# Patient Record
Sex: Female | Born: 2000 | Race: White | Hispanic: No | Marital: Single
Health system: Southern US, Community
[De-identification: ages and names within clinical notes are randomized; demographics above are authoritative.]

## PROBLEM LIST (undated history)

## (undated) DIAGNOSIS — Z87448 Personal history of other diseases of urinary system: Secondary | ICD-10-CM

## (undated) DIAGNOSIS — Q969 Turner's syndrome, unspecified: Secondary | ICD-10-CM

## (undated) HISTORY — DX: Personal history of other diseases of urinary system: Z87.448

## (undated) HISTORY — PX: WISDOM TOOTH EXTRACTION: SHX21

## (undated) HISTORY — PX: ADENOIDECTOMY: SUR15

## (undated) HISTORY — DX: Turner's syndrome, unspecified: Q96.9

## (undated) HISTORY — PX: TYMPANOSTOMY TUBE PLACEMENT: SHX32

## (undated) HISTORY — PX: KIDNEY SURGERY: SHX687

## (undated) HISTORY — PX: TONSILLECTOMY: SUR1361

## (undated) HISTORY — PX: DENTAL SURGERY: SHX609

---
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
## 2020-08-15 IMAGING — CR RIGHT KNEE - COMPLETE 4+ VIEW
4 series · 4 of 4 positions shown · non-contrast
Comparison: None.

CLINICAL DATA: Recent fall with knee pain, initial encounter

EXAM:
RIGHT KNEE - COMPLETE 4+ VIEW

[knee ap]
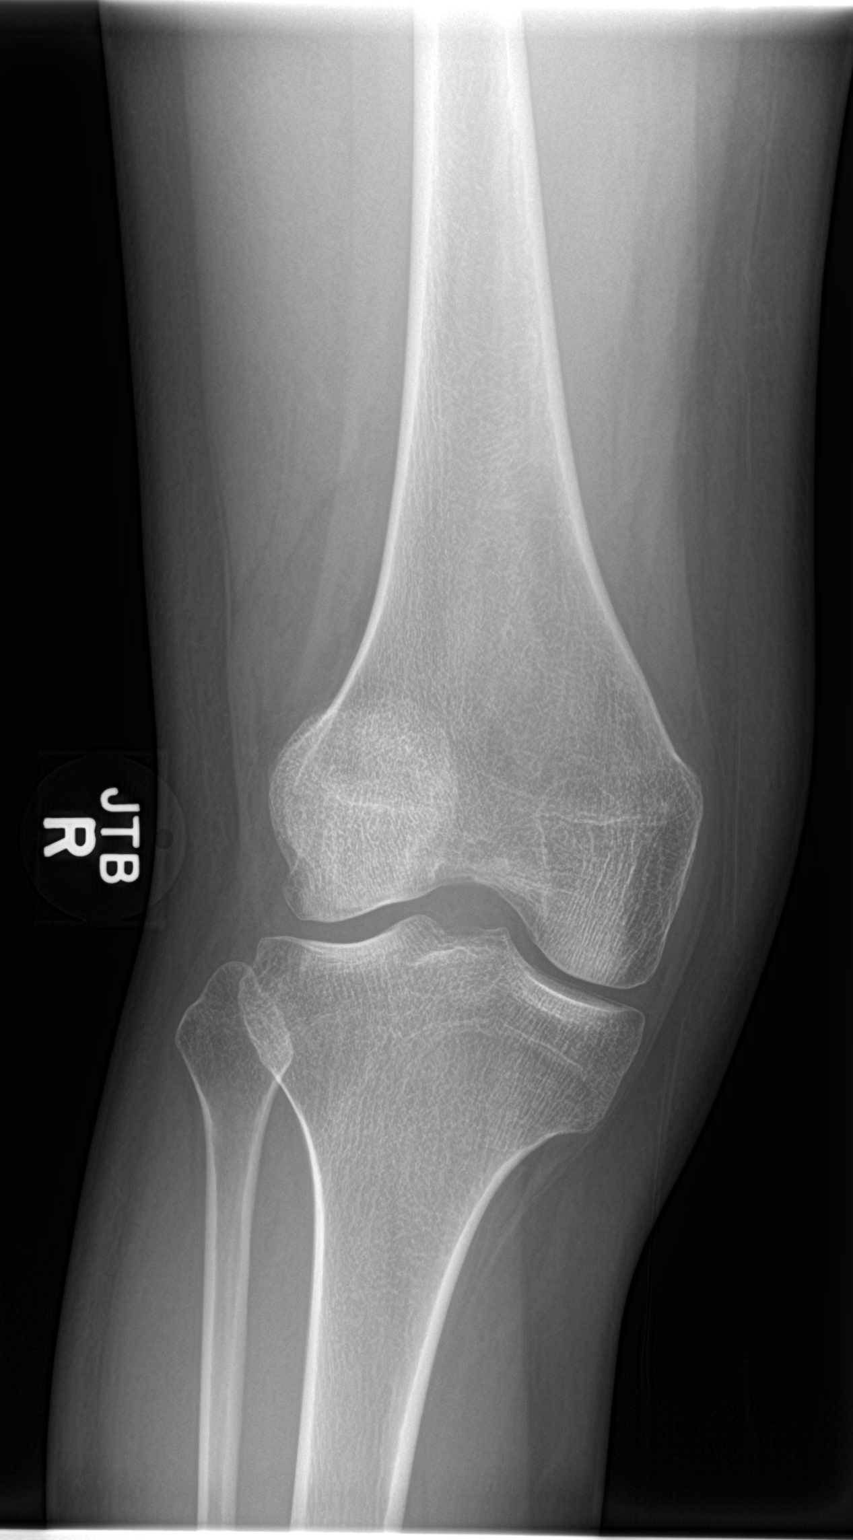

[knee obl (1 of 2)]
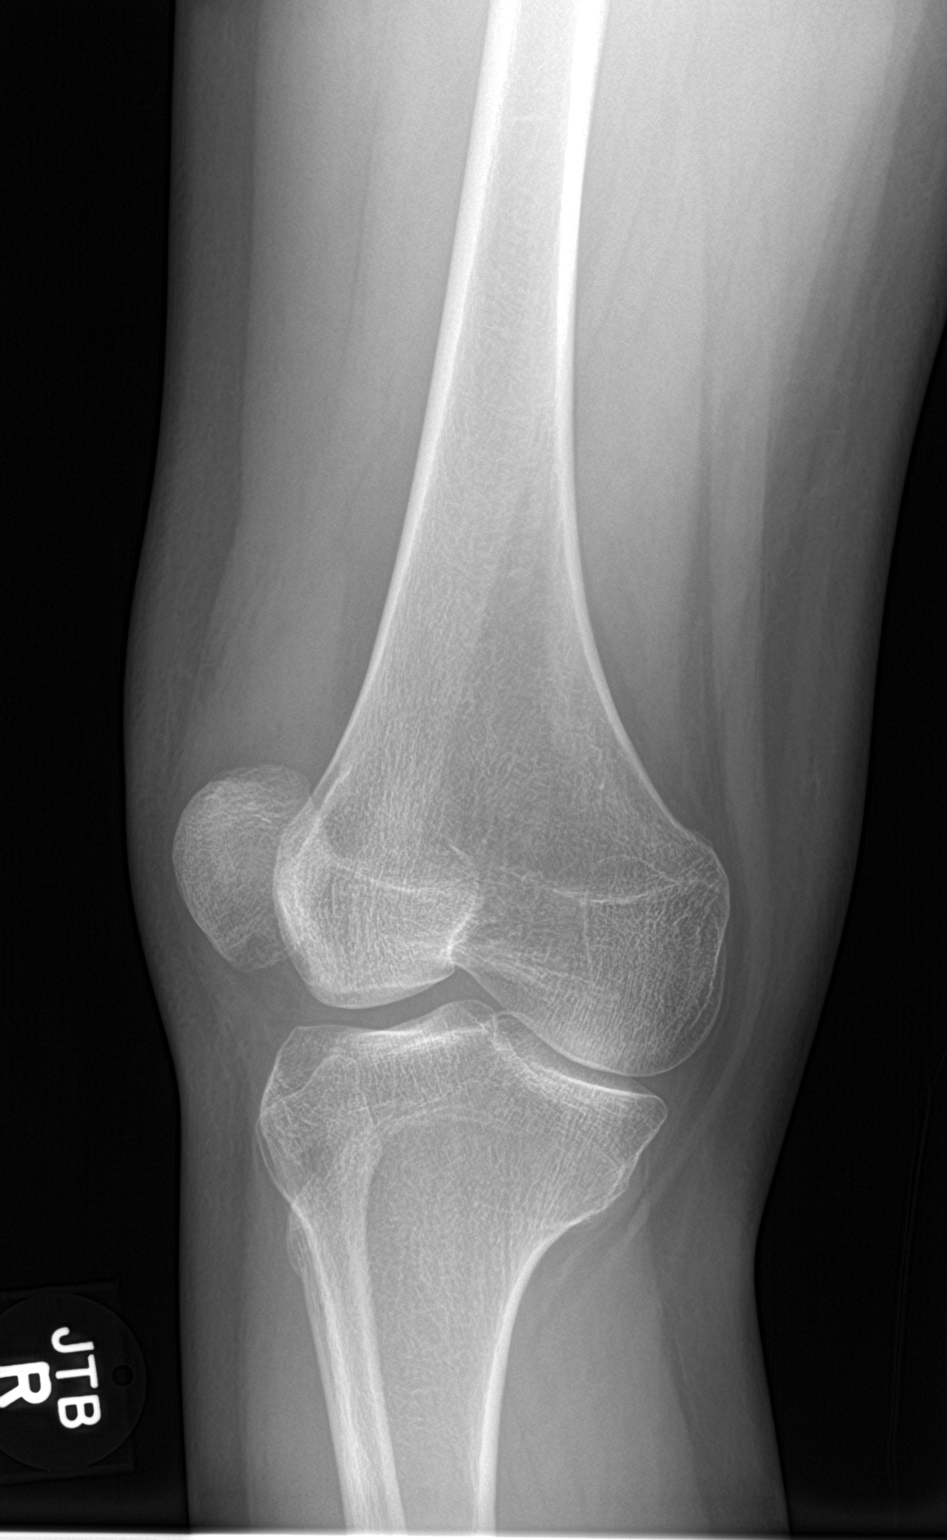

[knee obl (2 of 2)]
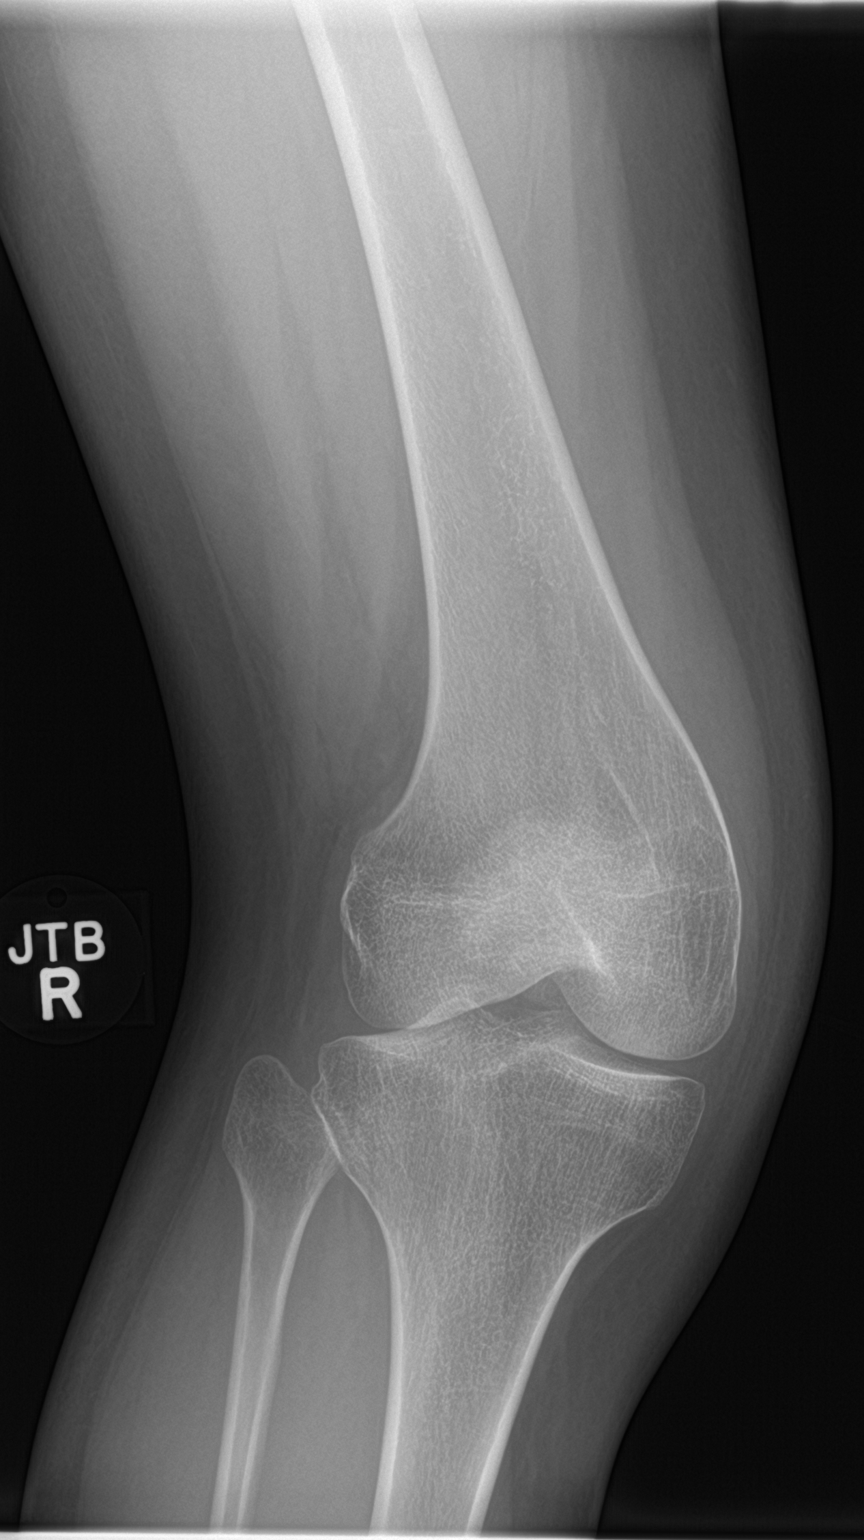

[knee lat]
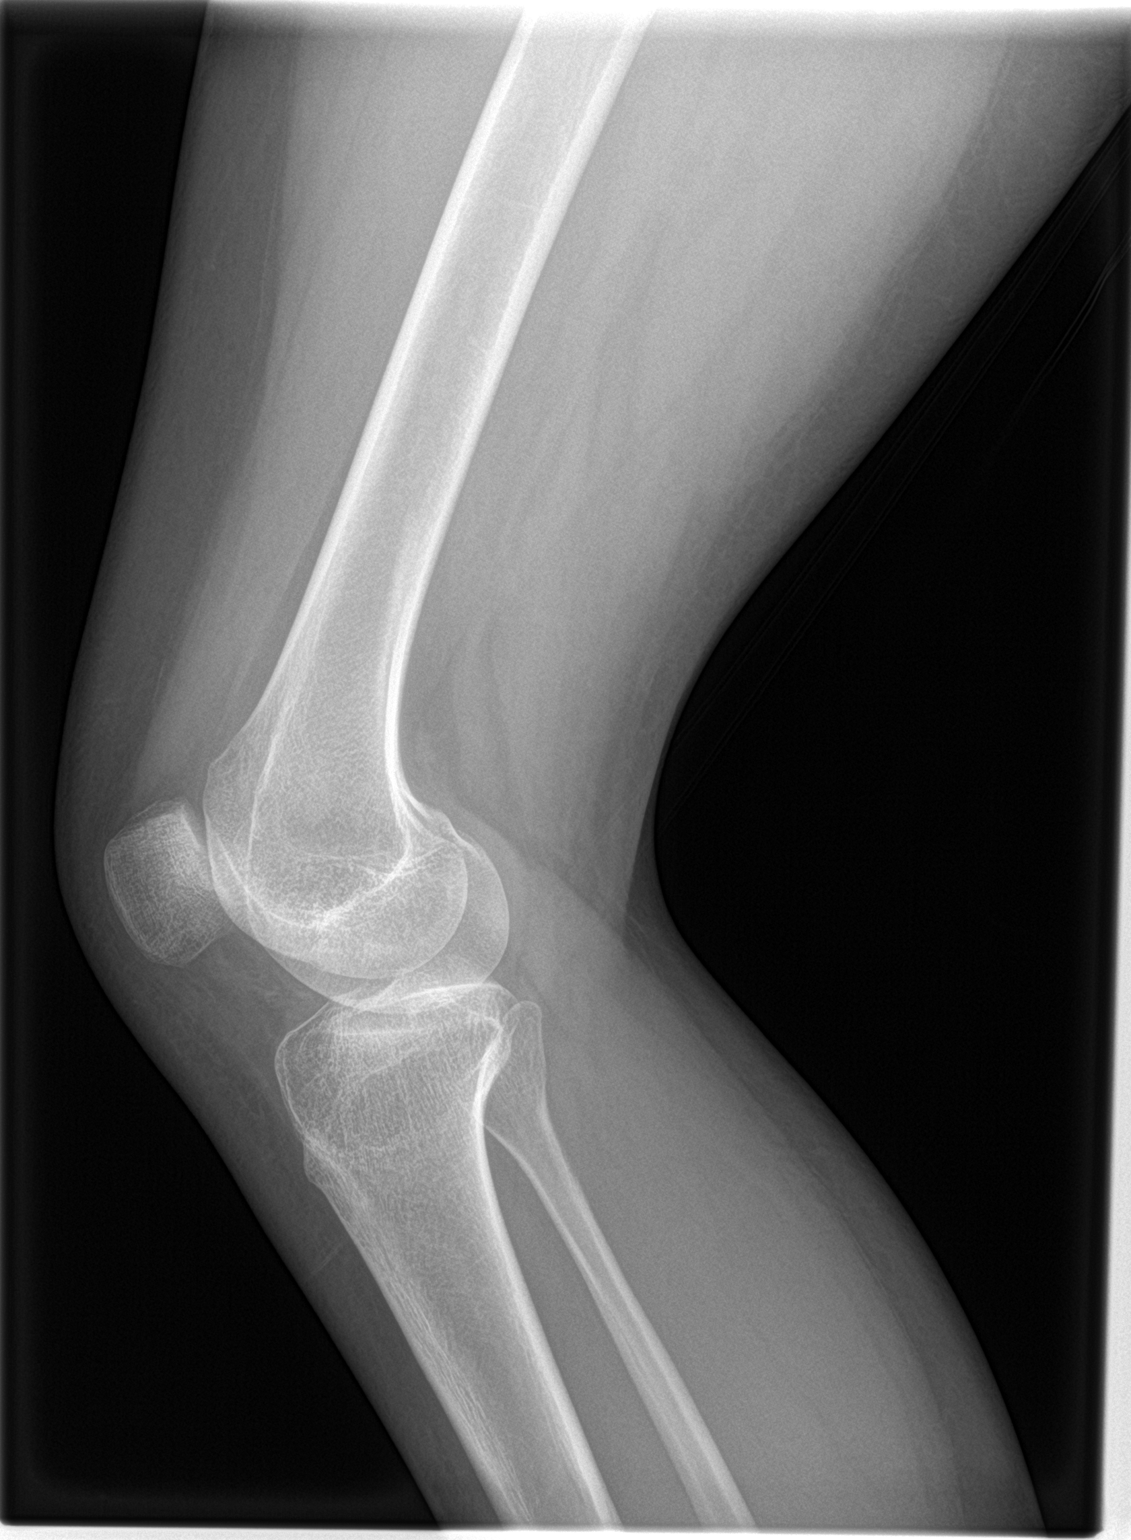

[4 of 4 positions shown; findings below may reference images not displayed]

FINDINGS: No acute fracture is noted. There are changes consistent with
lateral dislocation of the patella. No soft tissue abnormality is
noted.
IMPRESSION: Changes consistent with lateral patellar dislocation.
